# Patient Record
Sex: Male | Born: 1965 | Race: White | Hispanic: No | Marital: Married | Smoking: Current every day smoker
Health system: Southern US, Community
[De-identification: ages and names within clinical notes are randomized; demographics above are authoritative.]

## PROBLEM LIST (undated history)

## (undated) DIAGNOSIS — L0291 Cutaneous abscess, unspecified: Secondary | ICD-10-CM

## (undated) DIAGNOSIS — E119 Type 2 diabetes mellitus without complications: Secondary | ICD-10-CM

---

## 2019-02-15 ENCOUNTER — Other Ambulatory Visit: Payer: Self-pay

## 2019-02-15 ENCOUNTER — Emergency Department (HOSPITAL_BASED_OUTPATIENT_CLINIC_OR_DEPARTMENT_OTHER)
Admission: EM | Admit: 2019-02-15 | Discharge: 2019-02-15 | Disposition: A | Discharge status: Home / Self Care | Payer: No Typology Code available for payment source | Attending: Emergency Medicine | Admitting: Emergency Medicine

## 2019-02-15 ENCOUNTER — Encounter (HOSPITAL_BASED_OUTPATIENT_CLINIC_OR_DEPARTMENT_OTHER): Payer: Self-pay | Admitting: Emergency Medicine

## 2019-02-15 DIAGNOSIS — L02219 Cutaneous abscess of trunk, unspecified: Secondary | ICD-10-CM

## 2019-02-15 DIAGNOSIS — L03313 Cellulitis of chest wall: Secondary | ICD-10-CM | POA: Diagnosis not present

## 2019-02-15 DIAGNOSIS — F17200 Nicotine dependence, unspecified, uncomplicated: Secondary | ICD-10-CM | POA: Diagnosis not present

## 2019-02-15 DIAGNOSIS — L02213 Cutaneous abscess of chest wall: Secondary | ICD-10-CM | POA: Insufficient documentation

## 2019-02-15 DIAGNOSIS — L03319 Cellulitis of trunk, unspecified: Secondary | ICD-10-CM

## 2019-02-15 DIAGNOSIS — L089 Local infection of the skin and subcutaneous tissue, unspecified: Secondary | ICD-10-CM | POA: Diagnosis present

## 2019-02-15 LAB — COMPREHENSIVE METABOLIC PANEL
ALT: 16 U/L (ref 0–44)
AST: 11 U/L — ABNORMAL LOW (ref 15–41)
Albumin: 3.6 g/dL (ref 3.5–5.0)
Alkaline Phosphatase: 69 U/L (ref 38–126)
Anion gap: 10 (ref 5–15)
BUN: 12 mg/dL (ref 6–20)
CHLORIDE: 100 mmol/L (ref 98–111)
CO2: 24 mmol/L (ref 22–32)
Calcium: 8.6 mg/dL — ABNORMAL LOW (ref 8.9–10.3)
Creatinine, Ser: 0.74 mg/dL (ref 0.61–1.24)
GFR calc Af Amer: >60 mL/min (ref >60)
GFR calc non Af Amer: >60 mL/min (ref >60)
Glucose, Bld: 257 mg/dL — ABNORMAL HIGH (ref 70–99)
Potassium: 3.6 mmol/L (ref 3.5–5.1)
Sodium: 134 mmol/L — ABNORMAL LOW (ref 135–145)
Total Bilirubin: 0.7 mg/dL (ref 0.3–1.2)
Total Protein: 7.2 g/dL (ref 6.5–8.1)

## 2019-02-15 LAB — CBC WITH DIFFERENTIAL/PLATELET
Abs Immature Granulocytes: 0.04 10*3/uL (ref 0.00–0.07)
Basophils Absolute: 0.1 10*3/uL (ref 0.0–0.1)
Basophils Relative: 1 %
Eosinophils Absolute: 0.1 10*3/uL (ref 0.0–0.5)
Eosinophils Relative: 1 %
HCT: 44.7 % (ref 39.0–52.0)
Hemoglobin: 15.4 g/dL (ref 13.0–17.0)
Immature Granulocytes: 0 %
Lymphocytes Relative: 13 %
Lymphs Abs: 1.7 10*3/uL (ref 0.7–4.0)
MCH: 29.8 pg (ref 26.0–34.0)
MCHC: 34.5 g/dL (ref 30.0–36.0)
MCV: 86.6 fL (ref 80.0–100.0)
Monocytes Absolute: 0.9 10*3/uL (ref 0.1–1.0)
Monocytes Relative: 7 %
NEUTROS ABS: 10.5 10*3/uL — AB (ref 1.7–7.7)
Neutrophils Relative %: 78 %
Platelets: 277 10*3/uL (ref 150–400)
RBC: 5.16 MIL/uL (ref 4.22–5.81)
RDW: 12.5 % (ref 11.5–15.5)
WBC: 13.3 10*3/uL — ABNORMAL HIGH (ref 4.0–10.5)
nRBC: 0 % (ref 0.0–0.2)

## 2019-02-15 MED ORDER — CLINDAMYCIN HCL 150 MG PO CAPS: 300.0000 mg | ORAL_CAPSULE | Freq: Three times a day (TID) | ORAL | 0 refills | Status: AC

## 2019-02-15 MED ORDER — CLINDAMYCIN PHOSPHATE 600 MG/50ML IV SOLN
600.0000 mg | Freq: Once | INTRAVENOUS | Status: AC
  Administered 2019-02-15: 600 mg via INTRAVENOUS
  Filled 2019-02-15: qty 50

## 2019-02-15 MED FILL — CLINDAMYCIN HCL 150 MG CAPS: 150 | 7 days supply | Qty: 42 | Fill #0

## 2019-02-15 NOTE — ED Triage Notes (Signed)
Reports he had a chest tattoo done on February 7.  States this has since become infected.  Purulent drainage noted at triage to right chest with erythema.

## 2019-02-15 NOTE — ED Provider Notes (Signed)
[facility] EMERGENCY DEPARTMENT Provider Note   CSN: [id number] Arrival date & time: 02/15/19  1321    History   Chief Complaint Chief Complaint  Patient presents with  . Wound Infection    HPI Duane Miller is a 53 y.o. male.     53yo M w/ PMH including T2DM, tobacco use who p/w wound infection. On 01/19/2019, he had a R chest tattoo done. He has been doing his normal tattoo wound care including washing with soap and water and applying Aquaphor.  Over the past couple of weeks, he has begun having redness in the area and he had a few spots that look like pimples pop up.  1 area began draining pus and he has developed worsening redness and swelling.  He began running fevers at home 3 days ago. No vomiting or shortness of breath.   The history is provided by the patient.    History reviewed. No pertinent past medical history.  There are no active problems to display for this patient.   ** The histories are not reviewed yet. Please review them in the "History" navigator section and refresh this SmartLink.   PMH: T2DM, diet controlled   Home Medications    Prior to Admission medications   Medication Sig Start Date End Date Taking? Authorizing Provider  clindamycin (CLEOCIN) 150 MG capsule Take 2 capsules (300 mg total) by mouth 3 (three) times daily for 7 days. 02/15/19 02/22/19  Kingsten Enfield, Ambrose Finland, MD    Family History History reviewed. No pertinent family history.  Social History Social History   Tobacco Use  . Smoking status: Current Every Day Smoker    Packs/day: 2.00  . Smokeless tobacco: Never Used  Substance Use Topics  . Alcohol use: Yes  . Drug use: Never     Allergies   Patient has no known allergies.   Review of Systems Review of Systems All other systems reviewed and are negative except that which was mentioned in HPI   Physical Exam Updated Vital Signs BP 135/73   Pulse 88   Temp 98.1 F (36.7 C) (Oral)   Resp 16   Ht   (1.88 m)   Wt 102.1 kg   SpO2 96%   BMI 28.89 kg/m   Physical Exam Vitals signs and nursing note reviewed.  Constitutional:      General: He is not in acute distress.    Appearance: He is well-developed.  HENT:     Head: Normocephalic and atraumatic.     Mouth/Throat:     Mouth: Mucous membranes are moist.     Pharynx: Oropharynx is clear.     Comments: Poor dentition Eyes:     Conjunctiva/sclera: Conjunctivae normal.  Neck:     Musculoskeletal: Neck supple.  Cardiovascular:     Rate and Rhythm: Normal rate and regular rhythm.     Heart sounds: Normal heart sounds. No murmur.  Pulmonary:     Effort: Pulmonary effort is normal.     Breath sounds: Normal breath sounds.  Abdominal:     General: Bowel sounds are normal. There is no distension.     Palpations: Abdomen is soft.     Tenderness: There is no abdominal tenderness.  Skin:    General: Skin is warm and dry.     Findings: Erythema and lesion present.     Comments: Large, 5cm x 5cm area of induration, swelling and fluctuance on R upper chest over new tattoo with 2 pustules on  lower part of tattoo, one draining pus; scab on L medial clavicle near sternoclavicular joint  Neurological:     Mental Status: He is alert and oriented to person, place, and time.     Comments: Fluent speech  Psychiatric:        Judgment: Judgment normal.      ED Treatments / Results  Labs (all labs ordered are listed, but only abnormal results are displayed) Labs Reviewed  COMPREHENSIVE METABOLIC PANEL - Abnormal; Notable for the following components:      Result Value   Sodium 134 (*)    Glucose, Bld 257 (*)    Calcium 8.6 (*)    AST 11 (*)    All other components within normal limits  CBC WITH DIFFERENTIAL/PLATELET - Abnormal; Notable for the following components:   WBC 13.3 (*)    Neutro Abs 10.5 (*)    All other components within normal limits  CULTURE, BLOOD (ROUTINE X 2)  CULTURE, BLOOD (ROUTINE X 2)     EKG None  Radiology No results found.  Procedures Ultrasound ED Soft Tissue Date/Time: 02/15/2019 3:48 PM Performed by: Laurence Spates, MD Authorized by: Laurence Spates, MD   Procedure details:    Indications: localization of abscess     Transverse view:  Visualized   Longitudinal view:  Visualized   Images: archived   Location:    Location: chest     Side:  Right Findings:     cellulitis present Comments:     US obtained after manual drainage through spontaneous tract. Cobblestoning noted c/w cellulitis but no large fluid collections remain   (including critical care time)  Medications Ordered in ED Medications  clindamycin (CLEOCIN) IVPB 600 mg (600 mg Intravenous New Bag/Given 02/15/19 1534)     Initial Impression / Assessment and Plan / ED Course  I have reviewed the triage vital signs and the nursing notes.  Pertinent labs & imaging results that were available during my care of the patient were reviewed by me and considered in my medical decision making (see chart for details).        Exam concerning for large abscess w/ surrounding cellulitis on chest wall. Already draining spontaneously. VSS on arrival. Labs show elevated glucose without DKA, normal creatinine, WBC 13.3.  Because the patient has previously been treated for diabetes, I offered metformin but he prefers following up with a PCP as he did not like the side effects of metformin previously.  He also notes that he lost a significant amount of weight since he was last tested for diabetes.  I explained that some hyperglycemia may be expected with his ongoing infection.  Dr. Lockie Mola, who examined pt during signout, was able to express a significant amount of pus through the tract that had already spontaneously formed.  Repeat exam shows improvement in fluctuance and bedside ultrasound does not show any remaining large fluid collections.  Because he already has 2 tracts that are spontaneously  draining, I feel he can continue to maintain this at home with warm compresses.  He is otherwise well-appearing and feels comfortable with trial of antibiotics, understands need to return immediately if any of his symptoms appear to be worsening.  Gave dose of IV clindamycin here and course of clindamycin to use at home.  He will follow-up with a PCP regarding his blood glucose and understands that he needs to return here immediately if any worsening symptoms or fever.  Final Clinical Impressions(s) / ED Diagnoses  Final diagnoses:  Cellulitis and abscess of trunk    ED Discharge Orders         Ordered    clindamycin (CLEOCIN) 150 MG capsule  3 times daily     02/15/19 1541           Marshea Wisher, Ambrose Finland, MD 02/15/19 1552

## 2019-02-15 NOTE — ED Notes (Signed)
EDP at bedside  

## 2019-02-15 NOTE — ED Notes (Signed)
Pt verbalizes understanding of d/c instructions and denies any further needs at this time. 

## 2019-02-20 LAB — CULTURE, BLOOD (ROUTINE X 2)
Culture: NO GROWTH
Culture: NO GROWTH
Special Requests: ADEQUATE
Special Requests: ADEQUATE

## 2019-06-11 ENCOUNTER — Encounter (HOSPITAL_BASED_OUTPATIENT_CLINIC_OR_DEPARTMENT_OTHER): Payer: Self-pay | Admitting: Emergency Medicine

## 2019-06-11 ENCOUNTER — Emergency Department (HOSPITAL_BASED_OUTPATIENT_CLINIC_OR_DEPARTMENT_OTHER): Payer: 59

## 2019-06-11 ENCOUNTER — Other Ambulatory Visit: Payer: Self-pay

## 2019-06-11 ENCOUNTER — Emergency Department (HOSPITAL_BASED_OUTPATIENT_CLINIC_OR_DEPARTMENT_OTHER)
Admission: EM | Admit: 2019-06-11 | Discharge: 2019-06-11 | Disposition: A | Discharge status: Home / Self Care | Payer: 59 | Attending: Emergency Medicine | Admitting: Emergency Medicine

## 2019-06-11 DIAGNOSIS — E119 Type 2 diabetes mellitus without complications: Secondary | ICD-10-CM | POA: Diagnosis not present

## 2019-06-11 DIAGNOSIS — F17210 Nicotine dependence, cigarettes, uncomplicated: Secondary | ICD-10-CM | POA: Diagnosis not present

## 2019-06-11 DIAGNOSIS — M86171 Other acute osteomyelitis, right ankle and foot: Secondary | ICD-10-CM | POA: Insufficient documentation

## 2019-06-11 DIAGNOSIS — M79671 Pain in right foot: Secondary | ICD-10-CM | POA: Diagnosis present

## 2019-06-11 DIAGNOSIS — L02611 Cutaneous abscess of right foot: Secondary | ICD-10-CM | POA: Diagnosis not present

## 2019-06-11 DIAGNOSIS — L02619 Cutaneous abscess of unspecified foot: Secondary | ICD-10-CM

## 2019-06-11 DIAGNOSIS — M8610 Other acute osteomyelitis, unspecified site: Secondary | ICD-10-CM

## 2019-06-11 HISTORY — DX: Type 2 diabetes mellitus without complications: E11.9

## 2019-06-11 HISTORY — DX: Cutaneous abscess, unspecified: L02.91

## 2019-06-11 LAB — CBC WITH DIFFERENTIAL/PLATELET
Abs Immature Granulocytes: 0.09 10*3/uL — ABNORMAL HIGH (ref 0.00–0.07)
Basophils Absolute: 0.1 10*3/uL (ref 0.0–0.1)
Basophils Relative: 0 %
Eosinophils Absolute: 0.1 10*3/uL (ref 0.0–0.5)
Eosinophils Relative: 0 %
HCT: 45.1 % (ref 39.0–52.0)
Hemoglobin: 15.3 g/dL (ref 13.0–17.0)
Immature Granulocytes: 0 %
Lymphocytes Relative: 10 %
Lymphs Abs: 2.3 10*3/uL (ref 0.7–4.0)
MCH: 29.8 pg (ref 26.0–34.0)
MCHC: 33.9 g/dL (ref 30.0–36.0)
MCV: 87.7 fL (ref 80.0–100.0)
Monocytes Absolute: 1.3 10*3/uL — ABNORMAL HIGH (ref 0.1–1.0)
Monocytes Relative: 6 %
Neutro Abs: 18.5 10*3/uL — ABNORMAL HIGH (ref 1.7–7.7)
Neutrophils Relative %: 84 %
Platelets: 434 10*3/uL — ABNORMAL HIGH (ref 150–400)
RBC: 5.14 MIL/uL (ref 4.22–5.81)
RDW: 13.2 % (ref 11.5–15.5)
WBC: 22.5 10*3/uL — ABNORMAL HIGH (ref 4.0–10.5)
nRBC: 0 % (ref 0.0–0.2)

## 2019-06-11 LAB — BASIC METABOLIC PANEL
Anion gap: 16 — ABNORMAL HIGH (ref 5–15)
BUN: 16 mg/dL (ref 6–20)
CO2: 22 mmol/L (ref 22–32)
Calcium: 9.2 mg/dL (ref 8.9–10.3)
Chloride: 95 mmol/L — ABNORMAL LOW (ref 98–111)
Creatinine, Ser: 0.67 mg/dL (ref 0.61–1.24)
GFR calc Af Amer: >60 mL/min (ref >60)
GFR calc non Af Amer: >60 mL/min (ref >60)
Glucose, Bld: 375 mg/dL — ABNORMAL HIGH (ref 70–99)
Potassium: 4 mmol/L (ref 3.5–5.1)
Sodium: 133 mmol/L — ABNORMAL LOW (ref 135–145)

## 2019-06-11 LAB — LACTIC ACID, PLASMA: Lactic Acid, Venous: 1.3 mmol/L (ref 0.5–1.9)

## 2019-06-11 MED ORDER — METFORMIN HCL 500 MG PO TABS: 500.0000 mg | ORAL_TABLET | Freq: Two times a day (BID) | ORAL | 2 refills | Status: AC

## 2019-06-11 MED ORDER — DOXYCYCLINE HYCLATE 100 MG PO CAPS: 100.0000 mg | ORAL_CAPSULE | Freq: Two times a day (BID) | ORAL | 0 refills | Status: AC

## 2019-06-11 NOTE — ED Triage Notes (Addendum)
Right foot infection x1 week.  Redness and drainage. Worse the past couple days.  Also has abscess in left axilla that he would like checked.

## 2019-06-11 NOTE — Discharge Instructions (Signed)
Today you were found to have a deep infection between your first and second toe and concern for mild infection of the bone on that foot.  Your white blood cell count was very high and your blood sugar was 375.  We will try oral antibiotics at home however if symptoms worsen I urged you to return for admission and IV antibiotics

## 2019-06-11 NOTE — ED Provider Notes (Signed)
[facility] EMERGENCY DEPARTMENT Provider Note   CSN: [id number] Arrival date & time: 06/11/19  1606     History   Chief Complaint Chief Complaint  Patient presents with  . foot swelling    HPI Duane Miller is a 53 y.o. male.     Patient is a 53 year old male with history of abscesses and prior borderline diabetes who presents today with worsening foot pain, swelling and drainage.  Patient states that for the last 1 week he has noticed pain in his left foot.  He states that he started wearing some new boots to water his garden and started noticing some pain in the right great toe area.  However over the last few days the area has started to blister and spread and yesterday an area on his right great toe opened up and started draining.  Today he noticed spreading dark redness on the top of his foot.  He denies any malaise, myalgias, fevers.  He states he otherwise feels well.  He takes no medications at this time and has been eating and drinking normally. Secondly patient states approximately 2 weeks ago he had an abscess on the back of his neck that opened and has been draining since but that is significantly improved.  He has another small area on his neck and also a forming abscess in his left axilla but states it does not hurt and he does not want anything done about this today.  He denies alcohol or drug use.  The history is provided by the patient.    Past Medical History:  Diagnosis Date  . Abscess   . Diabetes mellitus without complication (HCC)     There are no active problems to display for this patient.   History reviewed. No pertinent surgical history.      Home Medications    Prior to Admission medications   Not on File    Family History No family history on file.  Social History Social History   Tobacco Use  . Smoking status: Current Every Day Smoker    Packs/day: 2.00  . Smokeless tobacco: Never Used  Substance Use Topics  . Alcohol  use: Yes    Comment: 1 beer/wk  . Drug use: Never     Allergies   Patient has no known allergies.   Review of Systems Review of Systems  All other systems reviewed and are negative.    Physical Exam Updated Vital Signs BP (!) 176/83 (BP Location: Right Arm)   Pulse (!) 122   Temp 98.6 F (37 C) (Oral)   Resp 20   Ht 6\' 2"  (1.88 m) Comment: Simultaneous filing. User may not have seen previous data.  Wt 104.3 kg Comment: Simultaneous filing. User may not have seen previous data.  SpO2 97%   BMI 29.53 kg/m   Physical Exam Vitals signs and nursing note reviewed.  Constitutional:      General: He is not in acute distress.    Appearance: He is well-developed.  HENT:     Head: Normocephalic and atraumatic.  Eyes:     Conjunctiva/sclera: Conjunctivae normal.     Pupils: Pupils are equal, round, and reactive to light.  Neck:     Musculoskeletal: Normal range of motion and neck supple.   Cardiovascular:     Rate and Rhythm: Regular rhythm. Tachycardia present.     Heart sounds: No murmur.  Pulmonary:     Effort: Pulmonary effort is normal. No respiratory distress.  Breath sounds: Normal breath sounds. No wheezing or rales.  Abdominal:     General: There is no distension.     Palpations: Abdomen is soft.     Tenderness: There is no abdominal tenderness. There is no guarding or rebound.  Musculoskeletal: Normal range of motion.        General: No tenderness.       Feet:  Skin:    General: Skin is warm and dry.     Findings: Abscess present. No erythema or rash.       Neurological:     Mental Status: He is alert and oriented to person, place, and time. Mental status is at baseline.  Psychiatric:        Mood and Affect: Mood normal.        Behavior: Behavior normal.        Thought Content: Thought content normal.      ED Treatments / Results  Labs (all labs ordered are listed, but only abnormal results are displayed) Labs Reviewed  CBC WITH  DIFFERENTIAL/PLATELET - Abnormal; Notable for the following components:      Result Value   WBC 22.5 (*)    Platelets 434 (*)    Neutro Abs 18.5 (*)    Monocytes Absolute 1.3 (*)    Abs Immature Granulocytes 0.09 (*)    All other components within normal limits  BASIC METABOLIC PANEL - Abnormal; Notable for the following components:   Sodium 133 (*)    Chloride 95 (*)    Glucose, Bld 375 (*)    Anion gap 16 (*)    All other components within normal limits  LACTIC ACID, PLASMA    EKG    Radiology Dg Foot Complete Right  Result Date: 06/11/2019 CLINICAL DATA:  53 year old male with left foot pain, abnormal soft tissue between the 1st and 2nd toe. Query osteomyelitis. EXAM: RIGHT FOOT COMPLETE - 3+ VIEW COMPARISON:  None. FINDINGS: Abnormal soft tissue contour, soft tissue swelling, and a small volume of associated soft tissue gas in the space between the 1st and 2nd rays with associated plantar soft tissue wound (arrows). There is associated subtle cortical osteolysis at the medial base of the right second proximal phalanx (arrow on image 2). The adjacent joint space is nonenlarged. Other regional joint spaces and osseous structures appear intact. No other acute osseous abnormality identified. Calcaneus and other tarsal bone degenerative spurring. IMPRESSION: 1. Subtle osteolysis suspicious for Acute Osteomyelitis at the medial base of the right second proximal phalanx. 2. Abnormal between the 1st and 2nd rays with plantar soft tissue wound and trace soft tissue gas. Electronically Signed   By: Genevie Ann M.D.   On: 06/11/2019 17:37    Procedures Procedures (including critical care time) INCISION AND DRAINAGE Performed by: Blanchie Dessert Consent: Verbal consent obtained. Risks and benefits: risks, benefits and alternatives were discussed Type: abscess  Body area: right foot  Anesthesia: none  Incision was made with scissors and 18g needle  Local anesthetic: none Complexity:  complex Blunt dissection to break up loculations  Drainage: purulent  Drainage amount: 71mL  Packing material: 1/4 in iodoform gauze placed btw 1st and 2nd toe  Patient tolerance: Patient tolerated the procedure well with no immediate complications.     Medications Ordered in ED Medications - No data to display   Initial Impression / Assessment and Plan / ED Course  I have reviewed the triage vital signs and the nursing notes.  Pertinent labs & imaging results  that were available during my care of the patient were reviewed by me and considered in my medical decision making (see chart for details).        53 year old gentleman presenting today with cellulitis and abscess of the right foot.  Most likely started after wearing a new pair of boots that did not fit correctly.  Patient has a history of borderline diabetes but does not take medication regularly.  He denies any systemic symptoms but does have several other areas of abscess.  He does have a tunneling area of abscess between his toes when blistered skin removed with scissors and tweezers area was opened and approximately 4 mL's of pus was drained out.  Patient has no specific pain in his foot but concern for possible osteo-.  Labs and x-ray are pending.  Patient will need antibiotics.  5:56 PM Labs are significant for a leukocytosis of 22,000, BMP with blood sugar of 375 and anion gap of 16 with a normal lactate.  Foot film shows subtle ostial lysis suspicious for acute osteomyelitis at the medial base of the right second proximal phalanx.  Findings discussed with the patient and he was offered admission for IV antibiotics however patient is refusing to stay and wants to try oral antibiotics at home.  Discussed with the patient that he could become septic or lose his foot but he understands those risks and still wants to try to manage at home.  He was also started on metformin for the diabetes.  Final Clinical Impressions(s) / ED  Diagnoses   Final diagnoses:  Acute osteomyelitis (HCC)  Foot abscess    ED Discharge Orders         Ordered    doxycycline (VIBRAMYCIN) 100 MG capsule  2 times daily     06/11/19 1825    metFORMIN (GLUCOPHAGE) 500 MG tablet  2 times daily with meals     06/11/19 1825           Gwyneth SproutPlunkett, Mattew Chriswell, MD 06/11/19 1827

## 2019-06-11 NOTE — ED Notes (Signed)
Pt has a deep open lesion on posterior neck with purulent drainage and localized erythema. Pt has large mass under L axilla that is not open. Pt denies pain to the axilla. L foot has large vesicular area and erythema that covers the majority of the foot. Area is minimally tender.

## 2019-06-13 ENCOUNTER — Telehealth: Payer: Self-pay | Admitting: *Deleted

## 2019-06-13 ENCOUNTER — Other Ambulatory Visit: Payer: Self-pay

## 2019-06-13 ENCOUNTER — Encounter (HOSPITAL_BASED_OUTPATIENT_CLINIC_OR_DEPARTMENT_OTHER): Payer: Self-pay | Admitting: *Deleted

## 2019-06-13 ENCOUNTER — Emergency Department (HOSPITAL_BASED_OUTPATIENT_CLINIC_OR_DEPARTMENT_OTHER)
Admission: EM | Admit: 2019-06-13 | Discharge: 2019-06-13 | Disposition: A | Discharge status: Home / Self Care | Payer: 59 | Attending: Emergency Medicine | Admitting: Emergency Medicine

## 2019-06-13 DIAGNOSIS — M869 Osteomyelitis, unspecified: Secondary | ICD-10-CM | POA: Insufficient documentation

## 2019-06-13 DIAGNOSIS — Z7984 Long term (current) use of oral hypoglycemic drugs: Secondary | ICD-10-CM | POA: Diagnosis not present

## 2019-06-13 DIAGNOSIS — Z4800 Encounter for change or removal of nonsurgical wound dressing: Secondary | ICD-10-CM

## 2019-06-13 DIAGNOSIS — E119 Type 2 diabetes mellitus without complications: Secondary | ICD-10-CM | POA: Insufficient documentation

## 2019-06-13 DIAGNOSIS — F17210 Nicotine dependence, cigarettes, uncomplicated: Secondary | ICD-10-CM | POA: Diagnosis not present

## 2019-06-13 DIAGNOSIS — L03115 Cellulitis of right lower limb: Secondary | ICD-10-CM | POA: Insufficient documentation

## 2019-06-13 MED ORDER — BLOOD GLUCOSE MONITOR KIT: PACK | 0 refills | Status: AC

## 2019-06-13 NOTE — ED Notes (Signed)
Pt wound covered with xeroform- kerlex and acewrap. Pt given strict instructions for wound care and wound follow up. Reinforced importance of close follow up and management of diabetes and checking sugars. Pt voiced understanding. Ambulatory to check out

## 2019-06-13 NOTE — ED Provider Notes (Signed)
[facility] EMERGENCY DEPARTMENT Provider Note   CSN: [id number] Arrival date & time: 06/13/19  1351     History   Chief Complaint Chief Complaint  Patient presents with  . Wound Check    HPI Duane Miller is a 53 y.o. male.     Patient with history of diabetes presents to the emergency department for packing removal from right foot wound.  Patient was seen in the emergency department 2 days ago for a foot infection.  He had an incision and drainage at that time with packing placed.  X-ray was concerning for osteomyelitis.  Patient was offered admission but did not want to stay in the hospital for this.  Patient states that he just came off of furlough and cannot take short-term disability which is why he wants to try to treat this as an outpatient.  He does state that his wife was just contacted by [facility] and is getting in as a "emergency appointment".  He continues to decline admission to the hospital today.  He denies any fevers or chills.  He replaced the bandage last night and noted drainage on the bandage.  There is also some drainage this morning.  Overall he states that the redness and swelling seems to be improving on the doxycycline.  Onset of symptoms gradual.  Nothing makes symptoms better or worse.     Past Medical History:  Diagnosis Date  . Abscess   . Diabetes mellitus without complication ([place])     There are no active problems to display for this patient.   History reviewed. No pertinent surgical history.      Home Medications    Prior to Admission medications   Medication Sig Start Date End Date Taking? Authorizing Provider  doxycycline (VIBRAMYCIN) 100 MG capsule Take 1 capsule (100 mg total) by mouth 2 (two) times daily. 06/11/19   Blanchie Dessert, MD  metFORMIN (GLUCOPHAGE) 500 MG tablet Take 1 tablet (500 mg total) by mouth 2 (two) times daily with a meal. 06/11/19   Blanchie Dessert, MD    Family History History reviewed.  No pertinent family history.  Social History Social History   Tobacco Use  . Smoking status: Current Every Day Smoker    Packs/day: 2.00  . Smokeless tobacco: Never Used  Substance Use Topics  . Alcohol use: Yes    Comment: 1 beer/wk  . Drug use: Never     Allergies   Patient has no known allergies.   Review of Systems Review of Systems  Constitutional: Negative for activity change.  Musculoskeletal: Positive for arthralgias. Negative for back pain, gait problem, joint swelling and neck pain.  Skin: Positive for color change and wound.  Neurological: Negative for weakness and numbness.     Physical Exam Updated Vital Signs BP (!) 157/88   Pulse 100   Temp 98.7 F (37.1 C)   Resp 18   Ht 6' 2"  (1.88 m)   Wt 104.3 kg   SpO2 100%   BMI 29.53 kg/m   Physical Exam Vitals signs and nursing note reviewed.  Constitutional:      Appearance: He is well-developed.  HENT:     Head: Normocephalic and atraumatic.  Eyes:     Conjunctiva/sclera: Conjunctivae normal.  Neck:     Musculoskeletal: Normal range of motion and neck supple.  Cardiovascular:     Pulses: Normal pulses. No decreased pulses.  Musculoskeletal:        General: Tenderness present.  Skin:  General: Skin is warm and dry.     Comments: Patient with irregular erythema across the forefoot and open wound on the medial aspect of the great toe with packing in place.  There is associated edema.  Minimal tenderness.  Exam is consistent with what was documented 2 days ago.  Neurological:     Mental Status: He is alert.     Sensory: No sensory deficit.     Comments: Motor, sensation, and vascular distal to the injury is fully intact.       ED Treatments / Results  Labs (all labs ordered are listed, but only abnormal results are displayed) Labs Reviewed - No data to display  EKG None  Radiology Dg Foot Complete Right  Result Date: 06/11/2019 CLINICAL DATA:  53 year old male with left foot pain,  abnormal soft tissue between the 1st and 2nd toe. Query osteomyelitis. EXAM: RIGHT FOOT COMPLETE - 3+ VIEW COMPARISON:  None. FINDINGS: Abnormal soft tissue contour, soft tissue swelling, and a small volume of associated soft tissue gas in the space between the 1st and 2nd rays with associated plantar soft tissue wound (arrows). There is associated subtle cortical osteolysis at the medial base of the right second proximal phalanx (arrow on image 2). The adjacent joint space is nonenlarged. Other regional joint spaces and osseous structures appear intact. No other acute osseous abnormality identified. Calcaneus and other tarsal bone degenerative spurring. IMPRESSION: 1. Subtle osteolysis suspicious for Acute Osteomyelitis at the medial base of the right second proximal phalanx. 2. Abnormal between the 1st and 2nd rays with plantar soft tissue wound and trace soft tissue gas. Electronically Signed   By: Genevie Ann M.D.   On: 06/11/2019 17:37    Procedures Procedures (including critical care time)  Medications Ordered in ED Medications - No data to display   Initial Impression / Assessment and Plan / ED Course  I have reviewed the triage vital signs and the nursing notes.  Pertinent labs & imaging results that were available during my care of the patient were reviewed by me and considered in my medical decision making (see chart for details).        Patient seen and examined.  Patient discussed with and seen by Dr. Sedonia Small.  Packing removed without complication.  Patient states that he was just called this afternoon with an appointment with primary care.  He continues to not want to be admitted for his possible osteomyelitis.  We discussed that treatment for osteomyelitis is more complicated than just a single week course of antibiotics and will likely need specialty consultation.  He verbalizes understanding.  He states that he is taking his diabetic medications.  We discussed the possibility of requiring  an amputation if his infection progresses and I stressed the importance for follow-up.  Encouraged return with worsening pain, drainage, fever, if redness continues to spread especially of the leg.  We discussed wound care and need for redressing the wound daily.  Vital signs reviewed and are as follows: BP (!) 157/88   Pulse 100   Temp 98.7 F (37.1 C)   Resp 18   Ht 6' 2"  (1.88 m)   Wt 104.3 kg   SpO2 100%   BMI 29.53 kg/m     Final Clinical Impressions(s) / ED Diagnoses   Final diagnoses:  Abscess packing removal  Cellulitis of right foot  Osteomyelitis of right foot, unspecified type [facility])   Patient here primarily for abscess packing removal.  Patient has a diabetic foot  infection and likely osteomyelitis.  He is resistant to being admitted for additional treatment and consultation.  He has seemingly obtained follow-up with primary care.  He will continue take his antibiotics and diabetes medications.  Strict return instructions as above.  ED Discharge Orders         Ordered    blood glucose meter kit and supplies KIT     06/13/19 1536           Carlisle Cater, PA-C 06/13/19 1551    Maudie Flakes, MD 06/15/19 [phone]

## 2019-06-13 NOTE — ED Notes (Signed)
ED Provider at bedside. 

## 2019-06-13 NOTE — ED Triage Notes (Signed)
Pt here for wound re check right foot

## 2019-06-13 NOTE — Discharge Instructions (Signed)
Please read and follow all provided instructions.  Your diagnoses today include:  1. Abscess packing removal   2. Cellulitis of right foot   3. Osteomyelitis of right foot, unspecified type ([place])     Tests performed today include:  Vital signs. See below for your results today.   Medications prescribed:   None  Take any prescribed medications only as directed.   Home care instructions:  Continue home antibiotics.  Follow any educational materials contained in this packet. Keep affected area above the level of your heart when possible. Wash area gently twice a day with warm soapy water. Do not apply alcohol or hydrogen peroxide. Cover the area if it draining or weeping.   Follow-up instructions: Return to the Emergency Department in 48 hours for a recheck if your symptoms are not significantly improved.   Follow-up with your primary care doctor as soon as possible.  Return instructions:  Return to the Emergency Department if you have:  Fever  Worsening symptoms  Worsening pain  Worsening swelling  Redness of the skin that moves away from the affected area, especially if it streaks away from the affected area   Any other emergent concerns  Your vital signs today were: BP (!) 157/88    Pulse 100    Temp 98.7 F (37.1 C)    Resp 18    Ht 6\' 2"  (1.88 m)    Wt 104.3 kg    SpO2 100%    BMI 29.53 kg/m  If your blood pressure (BP) was elevated above 135/85 this visit, please have this repeated by your doctor within one month. --------------

## 2019-06-13 NOTE — TOC Initial Note (Signed)
Transition of Care [facility]) - Initial/Assessment Note    Patient Details  Name: Duane Miller MRN: [id number] Date of Birth: Feb 13, 1966  [facility] CM/SW Contact:    Erenest Rasher, RN Phone Number: 06/13/2019, 4:25 PM  Clinical Narrative:    Referral PCP/3 ED visit in six months  Spoke to pt and wife, Duane Miller. Wife gave permission to schedule appt with PCP. [place] and appt scheduled for July 02, 2019 at 1:40 pm. Pt has hx of prediabetes and now has a foot wound. Educated pt/wife on importance of balanced meals eliminating or reducing sugar, artificial sweetners/process foods and carbohydrates to help with wound healing.  Explained his PCP/Endocrinologist will continue education on managing diabetes.               Expected Discharge Plan: Home/Self Care Barriers to Discharge: No Barriers Identified   Patient Goals and CMS Choice        Expected Discharge Plan and Services Expected Discharge Plan: Home/Self Care   Discharge Planning Services: CM Consult                                          Prior Living Arrangements/Services     Patient language and need for interpreter reviewed:: Yes        Need for Family Participation in Patient Care: No (Comment) Care giver support system in place?: No (comment)   Criminal Activity/Legal Involvement Pertinent to Current Situation/Hospitalization: No - Comment as needed  Activities of Daily Living      Permission Sought/Granted   Permission granted to share information with : Yes, Verbal Permission Granted  Share Information with NAME: Duane Miller     Permission granted to share info w Relationship: wife  Permission granted to share info w Contact Information: [phone]  Emotional Assessment       Orientation: : Oriented to Self, Oriented to Place, Oriented to  Time, Oriented to Situation   Psych Involvement: No (comment)  Admission diagnosis:  wound check There  are no active problems to display for this patient.  PCP:  Patient, No Pcp Per Pharmacy:   [facility] [address] [place] [address] Phone: [phone] Fax: [phone]  [phone] [place],  - [place] [address] Phone: [phone] Fax: [phone]     Social Determinants of Health (SDOH) Interventions    Readmission Risk Interventions No flowsheet data found.

## 2019-06-13 NOTE — Telephone Encounter (Signed)
Attempted call to pt to arrange follow up appt with a PCP. Pt's number are not working. Left message on wife's number, HIPAA compliant voice message for return call. Jonnie Finner RN CCM Case Mgmt phone [phone]

## 2019-06-14 ENCOUNTER — Telehealth: Payer: Self-pay | Admitting: *Deleted

## 2019-06-14 NOTE — Telephone Encounter (Signed)
After reviewing ED attending notes, appt scheduled for July 20 maybe too far off for wound check. Contacted wife and agreeable to have appt moved to earlier time. [facility], appt scheduled with Dr. Samara Deist on July 9 at 1:30 pm. Wife will cancel appt with [facility] once he has completed appt with [facility]. Provided wife with new appt time and contact information for [facility]. Gave permission to fax notes to clinic, # [phone].  Jonnie Finner RN CCM Case Mgmt phone [phone]

## 2020-07-06 IMAGING — DX RIGHT FOOT COMPLETE - 3+ VIEW
3 series · 3 of 3 positions shown · non-contrast
Comparison: None.

CLINICAL DATA: 52-year-old male with left foot pain, abnormal soft
tissue between the 1st and 2nd toe. Query osteomyelitis.

EXAM:
RIGHT FOOT COMPLETE - 3+ VIEW

[foot ap]
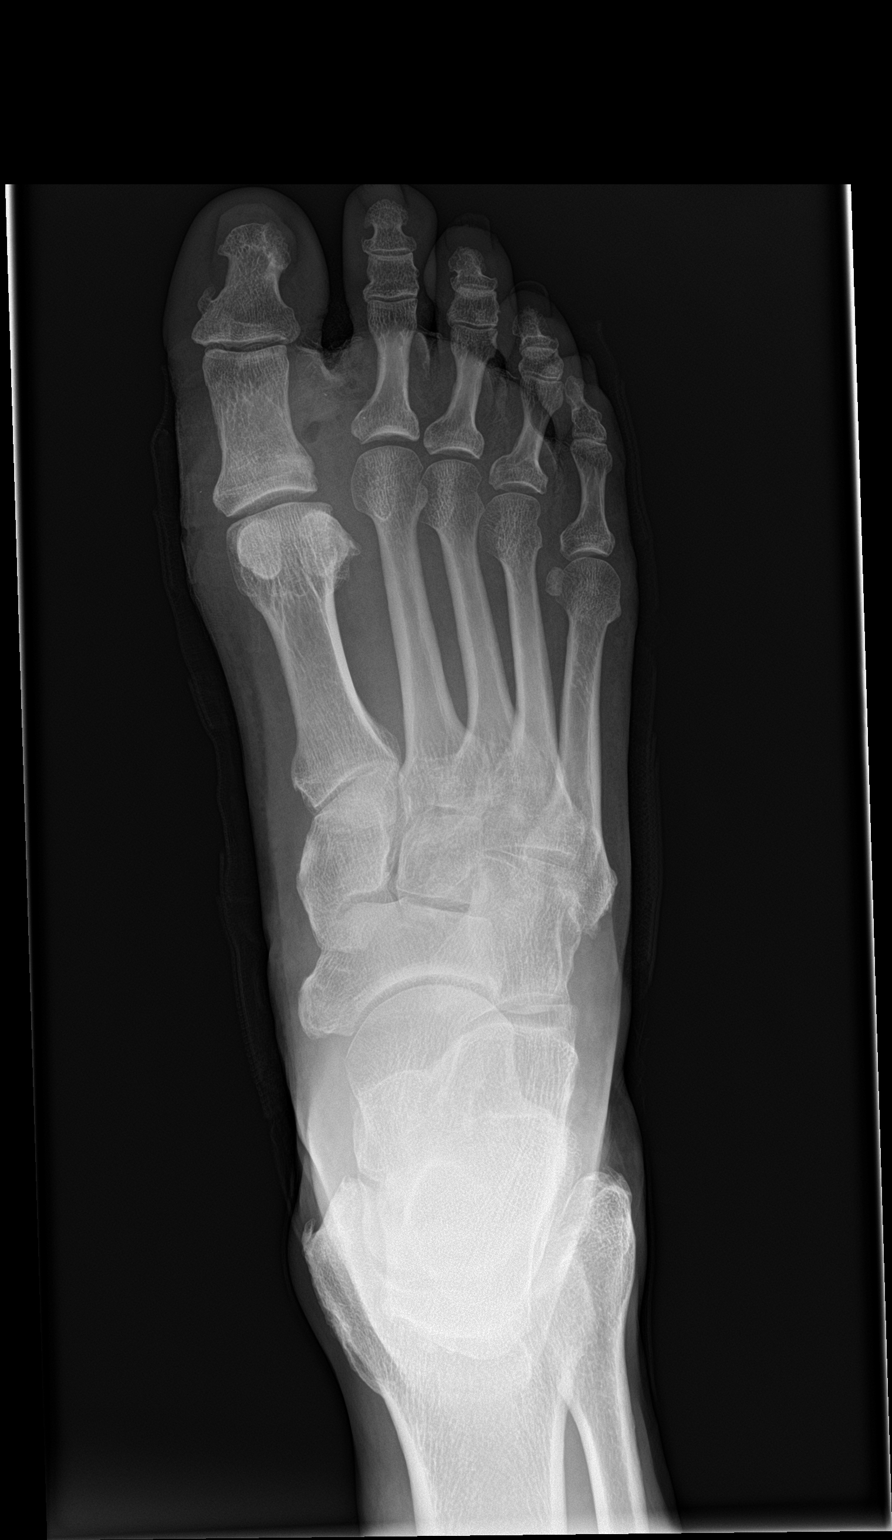

[foot obl]
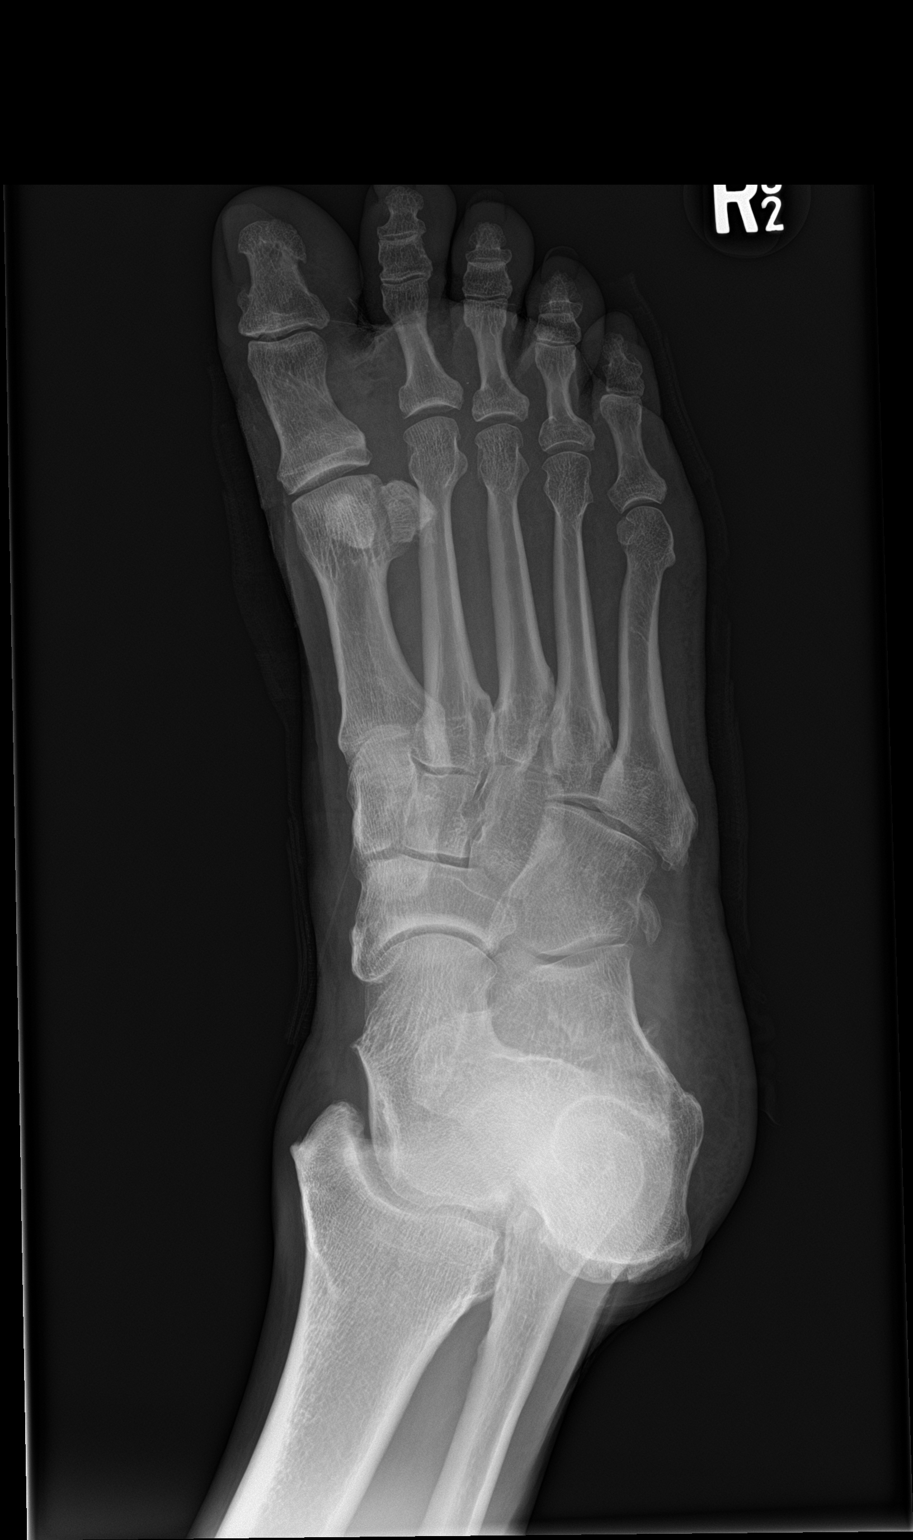

[foot lat]
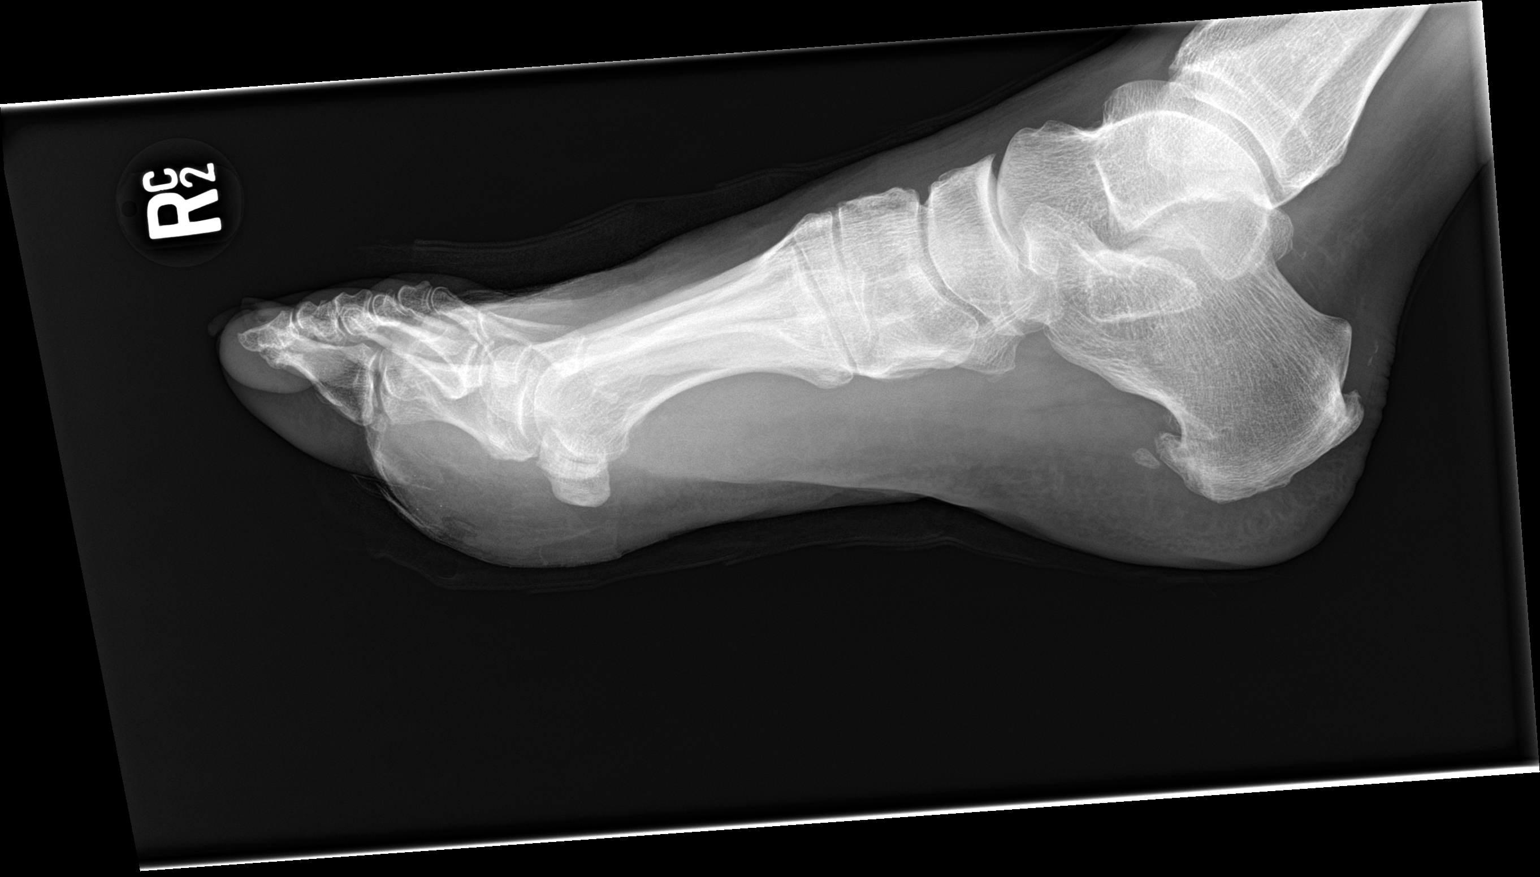

[3 of 3 positions shown; findings below may reference images not displayed]

FINDINGS: Abnormal soft tissue contour, soft tissue swelling, and a small
volume of associated soft tissue gas in the space between the 1st
and 2nd rays with associated plantar soft tissue wound (arrows).

There is associated subtle cortical osteolysis at the medial base of
the right second proximal phalanx (arrow on image 2). The adjacent
joint space is nonenlarged.

Other regional joint spaces and osseous structures appear intact. No
other acute osseous abnormality identified. Calcaneus and other
tarsal bone degenerative spurring.
IMPRESSION: 1. Subtle osteolysis suspicious for Acute Osteomyelitis at the
medial base of the right second proximal phalanx.
2. Abnormal between the 1st and 2nd rays with plantar soft tissue
wound and trace soft tissue gas.
